# Patient Record
Sex: Male | Born: 1995 | Race: Asian | Hispanic: No | Marital: Married
Health system: Southern US, Community
[De-identification: ages and names within clinical notes are randomized; demographics above are authoritative.]

---
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
## 2015-11-05 ENCOUNTER — Other Ambulatory Visit: Payer: Self-pay | Admitting: Occupational Medicine

## 2015-11-05 ENCOUNTER — Ambulatory Visit: Payer: Self-pay

## 2015-11-05 DIAGNOSIS — Z0000 Encounter for general adult medical examination without abnormal findings: Secondary | ICD-10-CM

## 2016-05-14 IMAGING — CR DG CHEST 1V
1 series · 1 of 1 positions shown · non-contrast
Comparison: 06/08/2008

CLINICAL DATA: Pre-employment physical

EXAM:
CHEST 1 VIEW

[view not recorded]
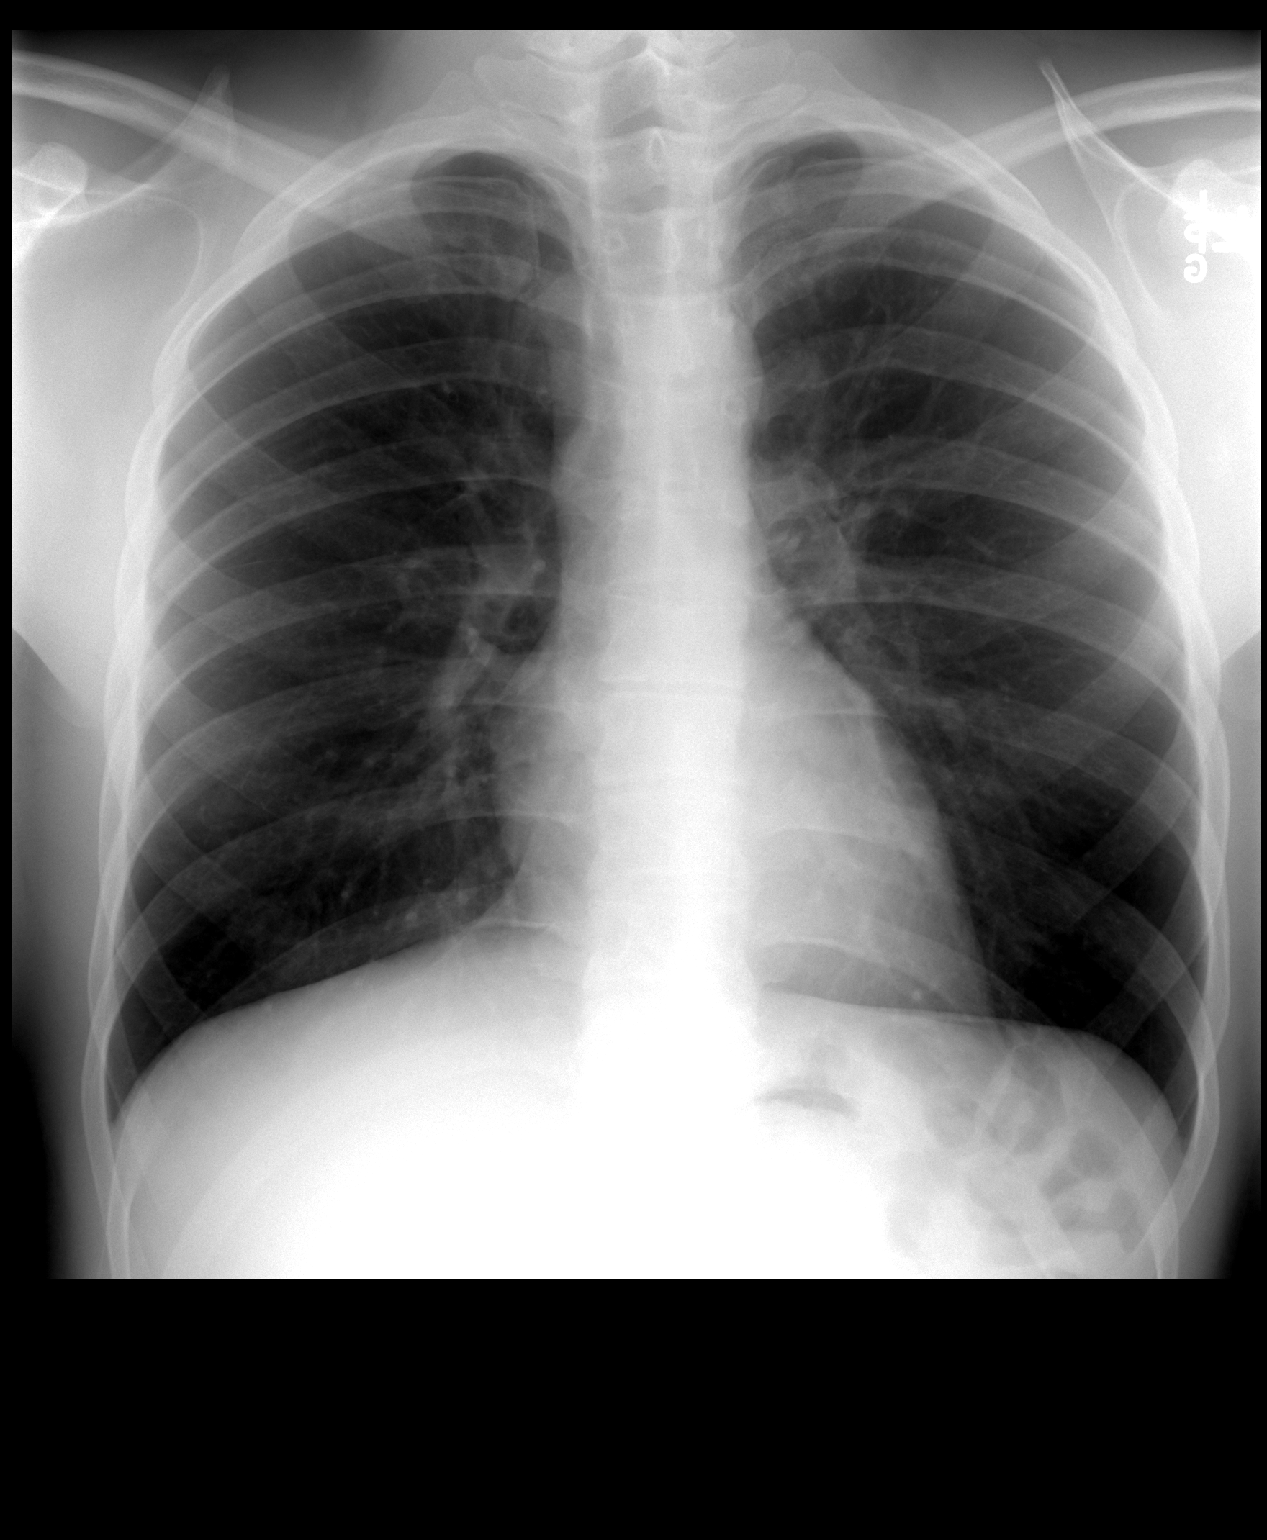

[1 of 1 positions shown; findings below may reference images not displayed]

FINDINGS: The heart size and mediastinal contours are within normal limits.
Both lungs are clear. The visualized skeletal structures are
unremarkable.
IMPRESSION: No active disease.

## 2017-01-09 ENCOUNTER — Ambulatory Visit (INDEPENDENT_AMBULATORY_CARE_PROVIDER_SITE_OTHER): Payer: Managed Care, Other (non HMO)

## 2017-01-09 ENCOUNTER — Ambulatory Visit (INDEPENDENT_AMBULATORY_CARE_PROVIDER_SITE_OTHER): Payer: Managed Care, Other (non HMO) | Admitting: Podiatry

## 2017-01-09 ENCOUNTER — Other Ambulatory Visit: Payer: Self-pay | Admitting: Sports Medicine

## 2017-01-09 ENCOUNTER — Encounter: Payer: Self-pay | Admitting: Podiatry

## 2017-01-09 DIAGNOSIS — M722 Plantar fascial fibromatosis: Secondary | ICD-10-CM

## 2017-01-09 MED ORDER — MELOXICAM 15 MG PO TABS: 15.0000 mg | ORAL_TABLET | Freq: Every day | ORAL | 0 refills | Status: AC

## 2017-01-09 MED ORDER — METHYLPREDNISOLONE 4 MG PO TBPK: ORAL_TABLET | ORAL | 0 refills | Status: AC

## 2017-01-09 NOTE — Progress Notes (Signed)
   Subjective:    Patient ID: Timoteo GaulJohn Sargent, male    DOB: 12/11/1995, 21 y.o.   MRN: [id number]  HPI: He presents today with his wife and 2 young boys; Kris HartmannSalina Jason and DevolJustin. These complaining of pain since February 2018 to the bilateral heels. He denies any trauma. He saw [place]iatrist in [facility] [place] gave him medicine injections and he states that he is no better.    Review of Systems  Constitutional: Positive for unexpected weight change.  Musculoskeletal: Positive for gait problem.  All other systems reviewed and are negative.      Objective:   Physical Exam: Vital signs are stable alert and oriented 3. Pulses are palpable. Neurologic sensorium is intact. Deep tendon reflexes are intact. Muscle strength is normal symmetrical bilateral. Orthopedic evaluation demonstrates all joints distal to the ankle range of motion without crepitation. Mild pes planus bilateral. Moderate to severe pain on palpation medial calcaneal tubercles bilateral radiographs taken today do demonstrate soft tissue increase in density of the plantar fascia calcaneal insertion site. No lesions or wounds are noted on cutaneous evaluation.        Assessment & Plan:  Chronic proximal plantar fasciitis bilateral.  Plan: Discussed etiology and pathology conservative versus surgical therapies. Injected bilateral heels today with Kenalog and local anesthetic start him on a Medrol Dosepak to be followed by meloxicam. I also recommended plantar fascia braces bilateral and the single night splint. Discussed appropriate shoe gear stretching exercises ice therapy and shoe gear modifications.  Orthotics will be ordered next visit if he is not completely well.

## 2017-01-09 NOTE — Progress Notes (Signed)
Patient called answering service stating that his steriod and anti-inflammatory medication was never sent to the pharmacy. So on behalf of Dr. Al CorpusHyatt I sent medrol dose pak and Meloxicam to patient's pharmacy for his plantar's fasciitis. -Dr. Marylene LandStover

## 2017-02-06 ENCOUNTER — Encounter: Payer: Self-pay | Admitting: Podiatry

## 2017-02-06 ENCOUNTER — Ambulatory Visit (INDEPENDENT_AMBULATORY_CARE_PROVIDER_SITE_OTHER): Payer: Managed Care, Other (non HMO) | Admitting: Podiatry

## 2017-02-06 VITALS — BP 154/92 | HR 65 | Resp 16

## 2017-02-06 DIAGNOSIS — M722 Plantar fascial fibromatosis: Secondary | ICD-10-CM

## 2017-02-06 MED ORDER — MELOXICAM 15 MG PO TABS: 15.0000 mg | ORAL_TABLET | Freq: Every day | ORAL | 3 refills | Status: AC

## 2017-02-06 NOTE — Progress Notes (Signed)
He presents today for follow-up of his bilateral plantar fasciitis states that the right foot is 100% pain-free however the left one is still mildly tender.  Objective: Vital signs are stable alert and oriented 3. Pulses are palpable. He has no pain palpation right heel. Positive pain on palpation medial calcaneal joint with the left heel.  Assessment: Plantar fasciitis left.  Plan: I reinjected his left heel today right heel is doing well. He is to continue his plantar fascial brace night splint and icing. He will continue appropriate shoe gear. I will follow-up with him in 1 month in which case if he is not 100% improved and we will consider orthotics.

## 2017-03-06 ENCOUNTER — Ambulatory Visit (INDEPENDENT_AMBULATORY_CARE_PROVIDER_SITE_OTHER): Payer: Managed Care, Other (non HMO) | Admitting: Podiatry

## 2017-03-06 ENCOUNTER — Encounter: Payer: Self-pay | Admitting: Podiatry

## 2017-03-06 DIAGNOSIS — M722 Plantar fascial fibromatosis: Secondary | ICD-10-CM

## 2017-03-07 NOTE — Progress Notes (Signed)
He presents today chief concern of plantar fasciitis of his left foot. He states it seems to be doing pretty well though when he mows the yard he states that it is significantly painful.  Objective: Pulses are palpable no change and physical findings he does have pain on palpation medial calcaneal tubercle of the left heel.  Assessment: Chronic contractible [place] fascitis that has improved from previous evaluation.  Plan: At this point I recommend orthotics and for him to continue his conservative therapies including plantar fascia braces are supported he wears in his medications. I refilled his meloxicam.

## 2017-04-03 ENCOUNTER — Ambulatory Visit (INDEPENDENT_AMBULATORY_CARE_PROVIDER_SITE_OTHER): Payer: Managed Care, Other (non HMO) | Admitting: Orthotics

## 2017-04-03 DIAGNOSIS — M722 Plantar fascial fibromatosis: Secondary | ICD-10-CM

## 2017-04-03 NOTE — Progress Notes (Signed)
Patient came in today to pick up custom made foot orthotics.  The goals were accomplished and the patient reported no dissatisfaction with said orthotics.  Patient was advised of breakin period and how to report any issues. 

## 2017-12-17 ENCOUNTER — Other Ambulatory Visit: Payer: Self-pay

## 2017-12-17 ENCOUNTER — Emergency Department (HOSPITAL_COMMUNITY): Payer: Self-pay

## 2017-12-17 ENCOUNTER — Emergency Department (HOSPITAL_COMMUNITY)
Admission: EM | Admit: 2017-12-17 | Discharge: 2017-12-17 | Disposition: A | Discharge status: Home / Self Care | Payer: Self-pay | Attending: Emergency Medicine | Admitting: Emergency Medicine

## 2017-12-17 ENCOUNTER — Encounter (HOSPITAL_COMMUNITY): Payer: Self-pay

## 2017-12-17 DIAGNOSIS — J069 Acute upper respiratory infection, unspecified: Secondary | ICD-10-CM | POA: Insufficient documentation

## 2017-12-17 DIAGNOSIS — Z79899 Other long term (current) drug therapy: Secondary | ICD-10-CM | POA: Insufficient documentation

## 2017-12-17 DIAGNOSIS — B9789 Other viral agents as the cause of diseases classified elsewhere: Secondary | ICD-10-CM

## 2017-12-17 DIAGNOSIS — J45909 Unspecified asthma, uncomplicated: Secondary | ICD-10-CM | POA: Insufficient documentation

## 2017-12-17 MED ORDER — ALBUTEROL SULFATE HFA 108 (90 BASE) MCG/ACT IN AERS
2.0000 | INHALATION_SPRAY | Freq: Once | RESPIRATORY_TRACT | Status: AC
  Administered 2017-12-17: 2 via RESPIRATORY_TRACT
  Filled 2017-12-17: qty 6.7

## 2017-12-17 NOTE — ED Triage Notes (Signed)
Pt reports cough, sore throat X1 week. Pt states he feels like something is caught in his throat. Airway intact. No distress noted. Dry cough noted. Pt also reports chills.

## 2017-12-17 NOTE — ED Provider Notes (Signed)
[facility] EMERGENCY DEPARTMENT Provider Note   CSN: [id number] Arrival date & time: 12/17/17  1008     History   Chief Complaint Chief Complaint  Patient presents with  . URI    HPI Mark Montes is a 22 y.o. male.  Mark Montes is a 22 y.o. Male with a history of asthma, who presents the emergency department for 1 week of cough, nasal congestion, rhinorrhea, sore throat.  Patient reports cough is occasionally productive of sputum.  He reports sore throat but no difficulty swallowing has been tolerating fluids and his saliva.  Patient reports some occasional sensation of pressure in the ears but no ear pain or discharge.  He reports some chest soreness with coughing but no chest pain or shortness of breath.  Patient denies any nausea, vomiting or abdominal pain.  He is been using NyQuil and DayQuil without much improvement has not tried anything else to treat his symptoms.  He reports chills but no fevers.  Patient reports history of asthma, feels like he has been wheezing occasionally, and no longer has an albuterol inhaler.  No known specific sick contacts.     Past Medical History:  Diagnosis Date  . Asthma     There are no active problems to display for this patient.   History reviewed. No pertinent surgical history.      Home Medications    Prior to Admission medications   Medication Sig Start Date End Date Taking? Authorizing Provider  albuterol (PROVENTIL HFA;VENTOLIN HFA) 108 (90 BASE) MCG/ACT inhaler Inhale 2 puffs into the lungs every 6 (six) hours as needed. For shortness of breath     [provider]  ibuprofen (ADVIL,MOTRIN) 200 MG tablet Take 200 mg by mouth daily as needed. For pain     [provider]  meloxicam (MOBIC) 15 MG tablet Take 1 tablet (15 mg total) by mouth daily. 01/09/17   Asencion Islam, DPM  meloxicam (MOBIC) 15 MG tablet Take 1 tablet (15 mg total) by mouth daily. 02/06/17   Hyatt, Max T, DPM  methylPREDNISolone  (MEDROL DOSEPAK) 4 MG TBPK tablet Take as instructed 01/09/17   Asencion Islam, DPM    Family History History reviewed. No pertinent family history.  Social History Social History   Tobacco Use  . Smoking status: Unknown If Ever Smoked  . Smokeless tobacco: Never Used  Substance Use Topics  . Alcohol use: Not on file  . Drug use: Not on file     Allergies   Patient has no known allergies.   Review of Systems Review of Systems  Constitutional: Positive for chills. Negative for fever.  HENT: Positive for congestion, postnasal drip, rhinorrhea, sneezing, sore throat and trouble swallowing. Negative for ear discharge, ear pain, sinus pressure and sinus pain.   Eyes: Negative for discharge, redness and itching.  Respiratory: Positive for cough. Negative for shortness of breath and wheezing.   Cardiovascular: Negative for chest pain and leg swelling.  Gastrointestinal: Negative for abdominal pain and nausea.  Genitourinary: Negative for difficulty urinating.  Musculoskeletal: Negative for arthralgias and myalgias.  Skin: Negative for color change and rash.     Physical Exam Updated Vital Signs BP (!) 165/89 (BP Location: Right Arm)   Pulse 61   Temp 98 F (36.7 C)   Resp 16   SpO2 100%   Physical Exam  Constitutional: He appears well-developed and well-nourished. No distress.  HENT:  Head: Normocephalic and atraumatic.  TMs clear with good landmarks, moderate  nasal mucosa edema with clear rhinorrhea, posterior oropharynx clear and moist, with some erythema, no edema or exudates, uvula midline  Eyes: Right eye exhibits no discharge. Left eye exhibits no discharge.  Neck: Normal range of motion. Neck supple.  No rigidity  Cardiovascular: Normal rate, regular rhythm, normal heart sounds and intact distal pulses.  Pulmonary/Chest: Effort normal and breath sounds normal. No stridor. No respiratory distress. He has no wheezes. He has no rales.  Respirations equal and  unlabored, patient able to speak in full sentences, lungs clear to auscultation bilaterally  Abdominal: Soft. Bowel sounds are normal. He exhibits no distension and no mass. There is no tenderness. There is no guarding.  Musculoskeletal: He exhibits no edema.  Lymphadenopathy:    He has no cervical adenopathy.  Neurological: He is alert. Coordination normal.  Skin: Skin is warm and dry. Capillary refill takes less than 2 seconds. He is not diaphoretic.  Psychiatric: He has a normal mood and affect. His behavior is normal.  Nursing note and vitals reviewed.    ED Treatments / Results  Labs (all labs ordered are listed, but only abnormal results are displayed) Labs Reviewed - No data to display  EKG None  Radiology No results found.  Procedures Procedures (including critical care time)  Medications Ordered in ED Medications  albuterol (PROVENTIL HFA;VENTOLIN HFA) 108 (90 Base) MCG/ACT inhaler 2 puff (has no administration in time range)     Initial Impression / Assessment and Plan / ED Course  I have reviewed the triage vital signs and the nursing notes.  Pertinent labs & imaging results that were available during my care of the patient were reviewed by me and considered in my medical decision making (see chart for details).  Pt presents with nasal congestion and cough. Pt is well appearing and vitals are normal. Lungs CTA on exam. Pt CXR negative for acute infiltrate. Patients symptoms are consistent with URI, likely viral etiology. Discussed that antibiotics are not indicated for viral infections. Pt will be discharged with symptomatic treatment.  Patient does have history of asthma and is out of his inhaler, refill provided here in the ED.  Verbalizes understanding and is agreeable with plan. Pt is hemodynamically stable & in NAD prior to dc.   Final Clinical Impressions(s) / ED Diagnoses   Final diagnoses:  Viral URI with cough    ED Discharge Orders    None         Legrand Rams 12/17/17 1737    Gerhard Munch, MD 12/19/17 2125

## 2017-12-17 NOTE — Discharge Instructions (Signed)
Your symptoms are likely caused by a viral upper respiratory infection. Antibiotics are not helpful in treating viral infection, the virus should run its course in about 5-7 days. Please make sure you are drinking plenty of fluids. You can treat your symptoms supportively with tylenol/ibuprofen for fevers and pains, Zyrtec and Flonase to heal with nasal congestion, and over the counter cough syrups and throat lozenges to help with cough. If your symptoms are not improving please follow up with you Primary doctor.  ° °If you develop persistent fevers, shortness of breath or difficulty breathing, chest pain, severe headache and neck pain, persistent nausea and vomiting or other new or concerning symptoms return to the Emergency department. ° °

## 2018-06-26 IMAGING — DX DG CHEST 2V
2 series · 2 of 2 positions shown · non-contrast
Comparison: Portable chest x-ray November 05, 2015

CLINICAL DATA: Cough and sore throat for the past week with
sensation of something being caught in his throat. No respiratory
distress. The cough is nonproductive. Patient reports chills.
History of asthma.

EXAM:
CHEST - 2 VIEW

[w chest pa]
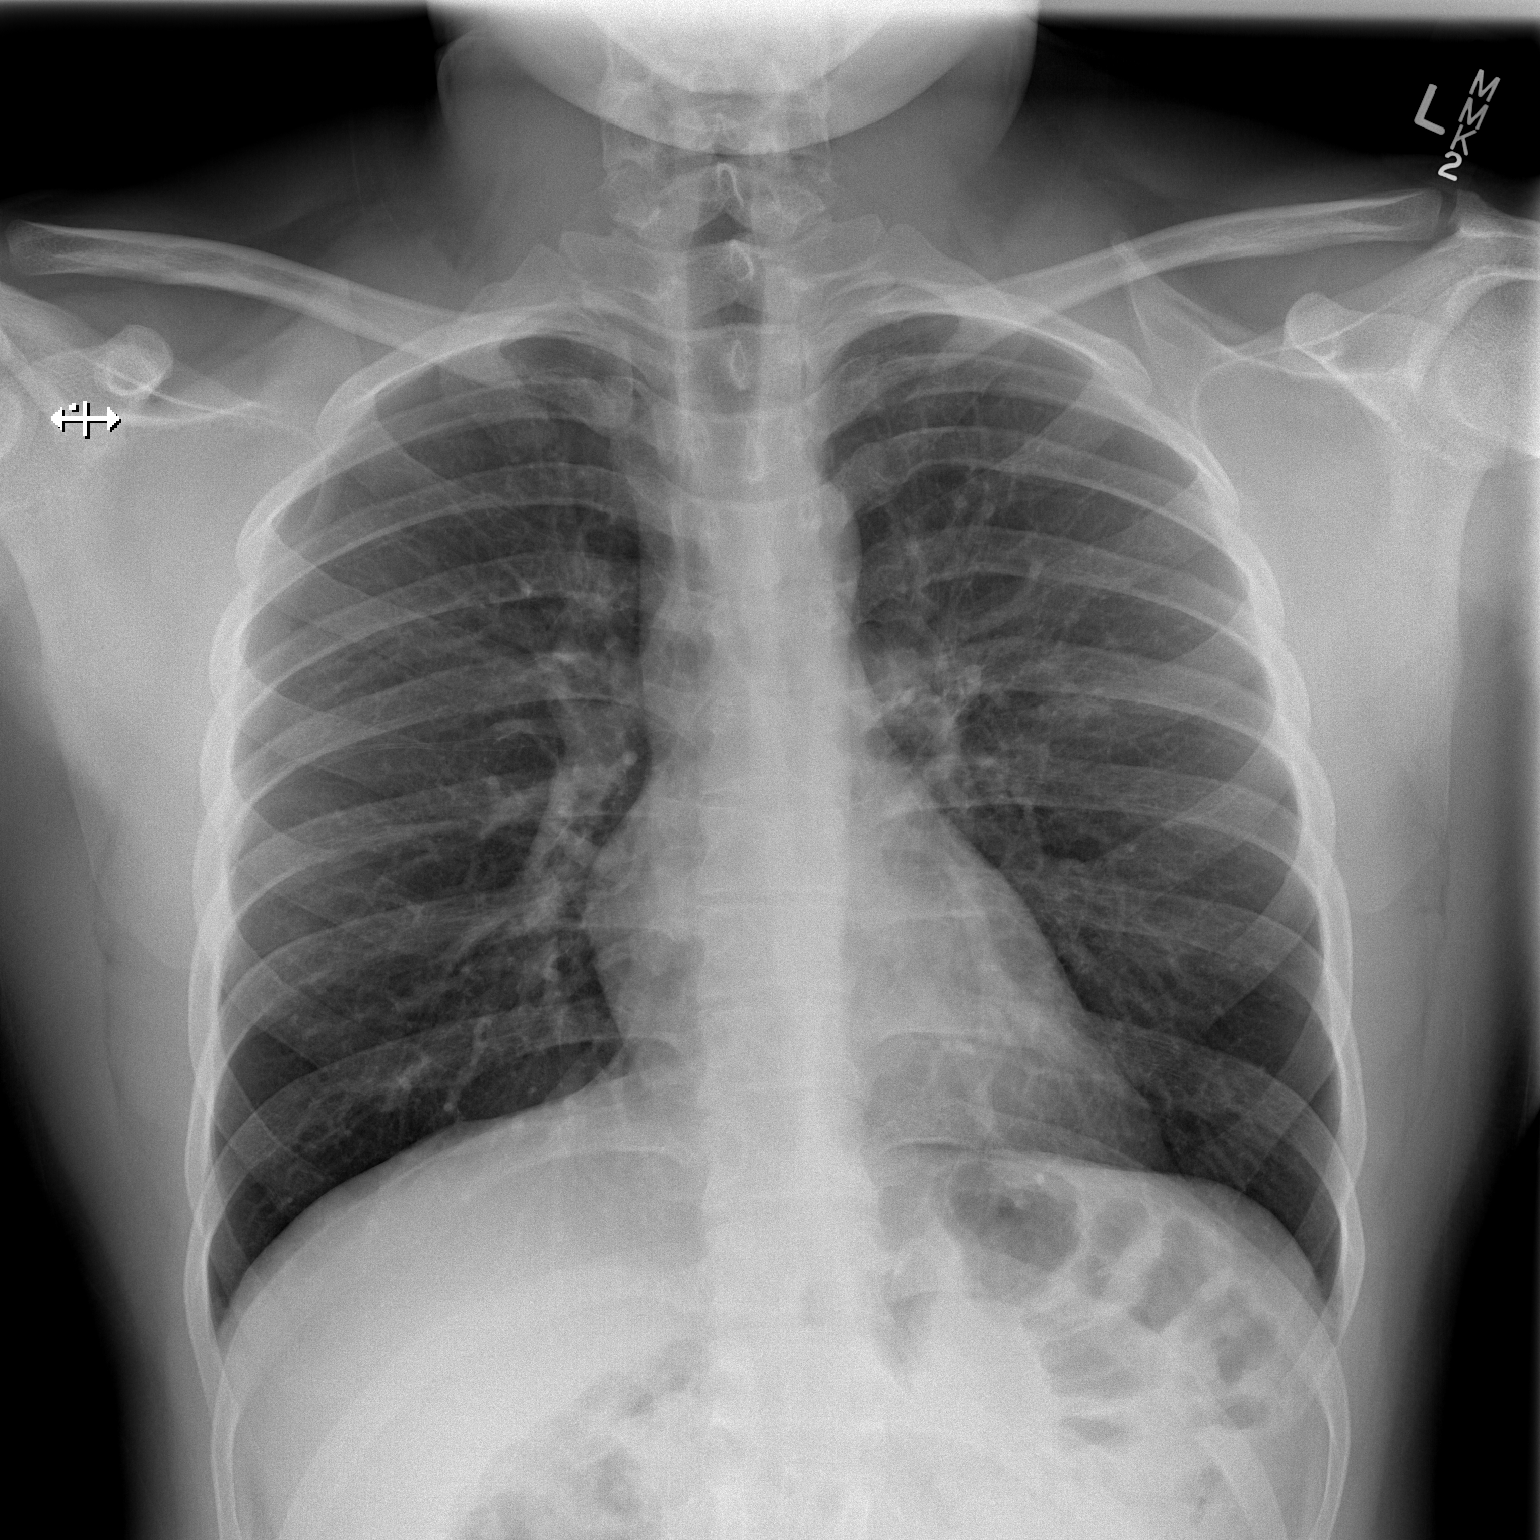

[w chest lat]
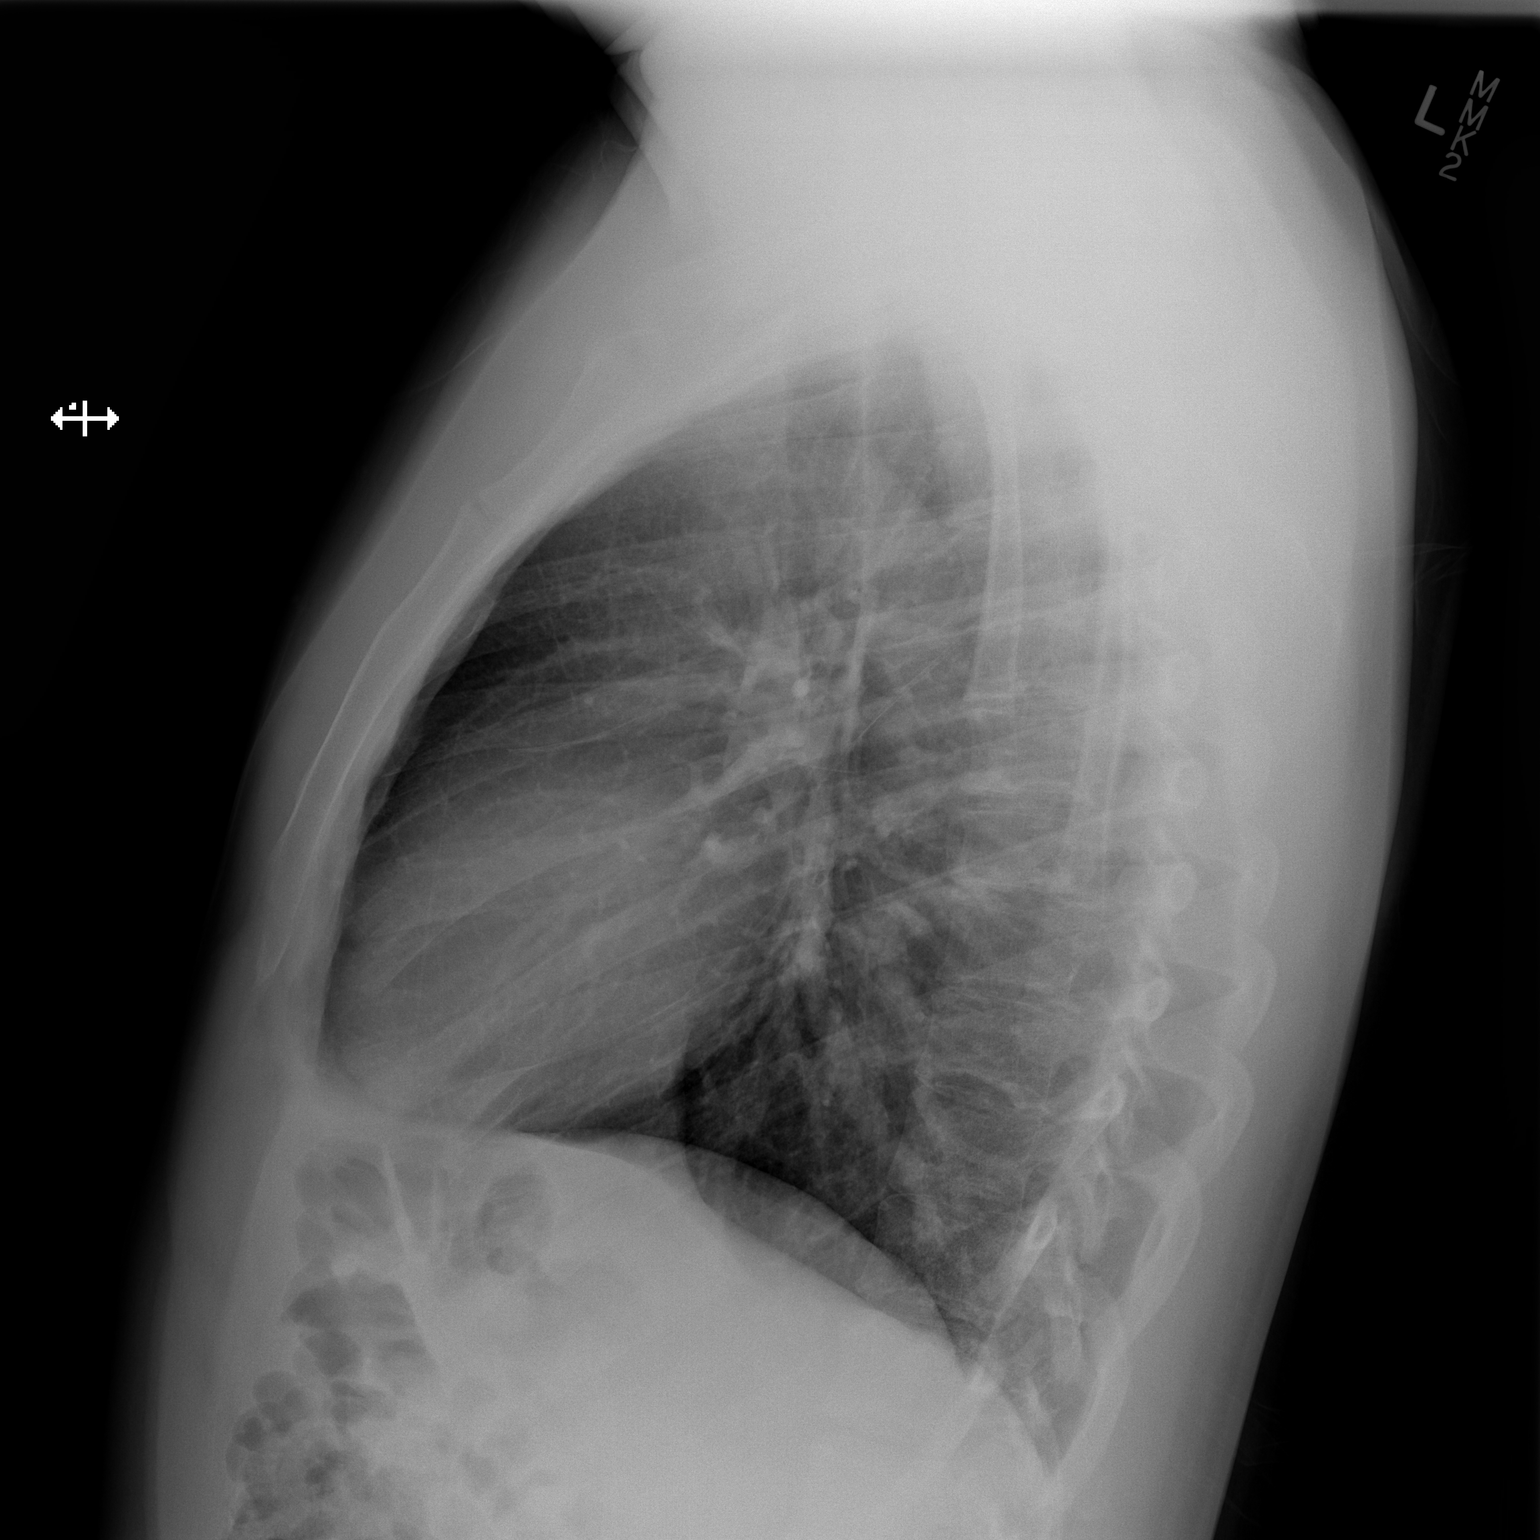

[2 of 2 positions shown; findings below may reference images not displayed]

FINDINGS: The lungs are adequately inflated. The interstitial markings are
coarse. There is no alveolar infiltrate. There is no pleural
effusion. The heart and pulmonary vascularity are normal. The
mediastinum is normal in width. The bony thorax exhibits no acute
abnormality.
IMPRESSION: There is no pneumonia nor other acute cardiopulmonary abnormality.

## 2021-11-03 ENCOUNTER — Ambulatory Visit (HOSPITAL_COMMUNITY)
Admission: EM | Admit: 2021-11-03 | Discharge: 2021-11-03 | Disposition: A | Discharge status: Home / Self Care | Payer: Managed Care, Other (non HMO) | Attending: Family Medicine | Admitting: Family Medicine

## 2021-11-03 ENCOUNTER — Encounter (HOSPITAL_COMMUNITY): Payer: Self-pay

## 2021-11-03 DIAGNOSIS — S0990XA Unspecified injury of head, initial encounter: Secondary | ICD-10-CM

## 2021-11-03 DIAGNOSIS — S0101XA Laceration without foreign body of scalp, initial encounter: Secondary | ICD-10-CM | POA: Diagnosis not present

## 2021-11-03 MED ORDER — LIDOCAINE-EPINEPHRINE-TETRACAINE (LET) TOPICAL GEL
3.0000 mL | Freq: Once | TOPICAL | Status: AC
  Administered 2021-11-03: 3 mL via TOPICAL

## 2021-11-03 MED ORDER — LIDOCAINE-EPINEPHRINE-TETRACAINE (LET) TOPICAL GEL: 3.0000 mL | Freq: Once | TOPICAL | Status: DC

## 2021-11-03 MED ORDER — LIDOCAINE-EPINEPHRINE-TETRACAINE (LET) TOPICAL GEL
TOPICAL | Status: AC
  Filled 2021-11-03: qty 3

## 2021-11-03 NOTE — Discharge Instructions (Signed)
Do your best to ensure adequate rest. If not allergic, take acetaminophen (Tylenol) every 4-6 hours as needed for discomfort. Often individuals will develop a headache associated with mild nausea in the days or hours after a head injury. This is called a concussion and may require further follow up. ? ?Please seek prompt medical care if: ?You have: ?A very bad (severe) headache that is not helped by medicine. ?Trouble walking or weakness in your arms and legs. ?Clear or bloody fluid coming from your nose or ears. ?Changes in your seeing (vision). ?Jerky movements that you cannot control (seizure). ?You throw up (vomit). ?Your symptoms get worse. ?You lose balance. ?Your speech is slurred. ?You pass out. ?You are sleepier and have trouble staying awake. ?The black centers of your eyes (pupils) change in size. ? ?These symptoms may be an emergency. Do not wait to see if the symptoms will go away. Get medical help right away. Call your local emergency services. Do not drive yourself to the hospital. ? ?

## 2021-11-03 NOTE — ED Triage Notes (Signed)
Pt presents with a laceration to the head. Pt states the rim of a basket ball goal fell on top of his head.  ?
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
## 2021-11-10 ENCOUNTER — Ambulatory Visit (HOSPITAL_COMMUNITY)
Admission: EM | Admit: 2021-11-10 | Discharge: 2021-11-10 | Disposition: A | Discharge status: Home / Self Care | Payer: Managed Care, Other (non HMO)

## 2021-11-10 NOTE — ED Triage Notes (Signed)
Pt presents to have 2 staples removed from left side of scalp. ?
